# Patient Record
Sex: Female | Born: 2000 | Race: White | Hispanic: No | Marital: Single | Smoking: Never smoker
Health system: Southern US, Community
[De-identification: ages and names within clinical notes are randomized; demographics above are authoritative.]

## PROBLEM LIST (undated history)

## (undated) ENCOUNTER — Ambulatory Visit (HOSPITAL_COMMUNITY): Admission: EM | Source: Home / Self Care

## (undated) DIAGNOSIS — T7840XA Allergy, unspecified, initial encounter: Secondary | ICD-10-CM

## (undated) DIAGNOSIS — F419 Anxiety disorder, unspecified: Secondary | ICD-10-CM

## (undated) DIAGNOSIS — F32A Depression, unspecified: Secondary | ICD-10-CM

## (undated) DIAGNOSIS — F988 Other specified behavioral and emotional disorders with onset usually occurring in childhood and adolescence: Secondary | ICD-10-CM

## (undated) DIAGNOSIS — E871 Hypo-osmolality and hyponatremia: Secondary | ICD-10-CM

## (undated) HISTORY — DX: Allergy, unspecified, initial encounter: T78.40XA

## (undated) HISTORY — DX: Other specified behavioral and emotional disorders with onset usually occurring in childhood and adolescence: F98.8

---
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
## 2022-03-14 ENCOUNTER — Encounter (HOSPITAL_BASED_OUTPATIENT_CLINIC_OR_DEPARTMENT_OTHER): Payer: Self-pay | Admitting: Emergency Medicine

## 2022-03-14 ENCOUNTER — Emergency Department (HOSPITAL_BASED_OUTPATIENT_CLINIC_OR_DEPARTMENT_OTHER)
Admission: EM | Admit: 2022-03-14 | Discharge: 2022-03-15 | Disposition: A | Discharge status: Home / Self Care | Payer: Medicaid Other | Attending: Emergency Medicine | Admitting: Emergency Medicine

## 2022-03-14 DIAGNOSIS — T7840XA Allergy, unspecified, initial encounter: Secondary | ICD-10-CM | POA: Insufficient documentation

## 2022-03-14 HISTORY — DX: Hypo-osmolality and hyponatremia: E87.1

## 2022-03-14 NOTE — ED Triage Notes (Signed)
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

## 2022-03-15 MED ORDER — PREDNISONE 10 MG (21) PO TBPK: ORAL_TABLET | ORAL | 0 refills | Status: DC

## 2022-03-15 MED ORDER — FAMOTIDINE IN NACL 20-0.9 MG/50ML-% IV SOLN
20.0000 mg | Freq: Once | INTRAVENOUS | Status: AC
  Administered 2022-03-15: 20 mg via INTRAVENOUS
  Filled 2022-03-15: qty 50

## 2022-03-15 MED ORDER — METHYLPREDNISOLONE SODIUM SUCC 125 MG IJ SOLR
125.0000 mg | Freq: Once | INTRAMUSCULAR | Status: AC
  Administered 2022-03-15: 125 mg via INTRAVENOUS
  Filled 2022-03-15: qty 2

## 2022-03-15 MED ORDER — DIPHENHYDRAMINE HCL 50 MG/ML IJ SOLN
25.0000 mg | Freq: Once | INTRAMUSCULAR | Status: AC
  Administered 2022-03-15: 25 mg via INTRAVENOUS
  Filled 2022-03-15: qty 1

## 2022-03-15 NOTE — ED Provider Notes (Signed)
[facility] EMERGENCY DEPARTMENT  Provider Note  CSN: [id number] Arrival date & time: 03/14/22 2326  History Chief Complaint  Patient presents with   Allergic Reaction    Rebecca Nolan is a 21 y.o. 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 She took some oral benadryl without improvement, reports the rash is now spreading and burning. She reports feeling some 'tightness' when she breathes but no tongue or lip swelling and no difficulty breathing.    Home Medications Prior to Admission medications   Medication Sig Start Date End Date Taking? Authorizing Provider  predniSONE (STERAPRED UNI-PAK 21 TAB) 10 MG (21) TBPK tablet 10mg  Tabs, 6 day taper. Use as directed 03/15/22  Yes 03/17/22, MD  benzonatate (TESSALON) 100 MG capsule Take 1-2 capsules (100-200 mg total) by mouth 3 (three) times daily as needed for cough. 08/16/16   McVey, 08/18/16, PA-C  Cholecalciferol (VITAMIN D3) 50 MCG (2000 UT) capsule 1 capsule    [provider]  fluticasone (FLONASE) 50 MCG/ACT nasal spray Place 2 sprays into both nostrils daily. 08/16/16   McVey, 08/18/16, PA-C  fluvoxaMINE (LUVOX) 50 MG tablet Take 50 mg by mouth at bedtime. 10/12/20   [provider]  hydrOXYzine (ATARAX/VISTARIL) 25 MG tablet TAKE 1-2 TABLETS AT BEDTIME FOR SLEEP    [provider]  lamoTRIgine (LAMICTAL) 25 MG tablet 1 tablet    [provider]  norethindrone (MICRONOR) 0.35 MG tablet Take 1 tablet by mouth daily. 06/30/20   [provider]  pantoprazole (PROTONIX) 40 MG tablet 1 TABLET BEFORE A MEAL ORALLY ONCE A DAY 90 DAYS 01/28/19   [provider]  prazosin (MINIPRESS) 2 MG capsule Take 2 mg by mouth 2 (two) times daily. 06/05/20   [provider]  silver sulfADIAZINE (SILVADENE) 1 % cream Apply 1 application topically daily. 10/13/20   10/15/20, MD     Allergies    Beef allergy and  Amoxicillin   Review of Systems   Review of Systems Please see HPI for pertinent positives and negatives  Physical Exam BP 111/65   Pulse 90   Temp 98.4 F (36.9 C) (Oral)   Resp 18   Ht 5' 6.75" (1.695 m)   Wt 55.8 kg   SpO2 98%   BMI 19.41 kg/m   Physical Exam Vitals and nursing note reviewed.  Constitutional:      Appearance: Normal appearance.  HENT:     Head: Normocephalic and atraumatic.     Nose: Nose normal.     Mouth/Throat:     Mouth: Mucous membranes are moist.     Comments: No tongue swelling Eyes:     Extraocular Movements: Extraocular movements intact.     Conjunctiva/sclera: Conjunctivae normal.  Cardiovascular:     Rate and Rhythm: Normal rate.  Pulmonary:     Effort: Pulmonary effort is normal.     Breath sounds: Normal breath sounds. No stridor.  Abdominal:     General: Abdomen is flat.     Palpations: Abdomen is soft.     Tenderness: There is no abdominal tenderness.  Musculoskeletal:        General: No swelling. Normal range of motion.     Cervical back: Neck supple.  Skin:    General: Skin is warm and dry.     Comments: Excoriations and some hives on arms and torso  Neurological:     General: No focal deficit present.  Mental Status: She is alert.  Psychiatric:        Mood and Affect: Mood normal.     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 Will give IV meds and reassess. Do not feel Epi is indicated  at this time.   ED Course   Clinical Course as of 03/15/22 0202  Sat Mar 15, 2022  0200 Patient reports rash and skin burning are improved. She is resting comfortably with mother at bedside. Will d/c with Rx for prednisone. Advised to follow up with PCP and/or Allergist if she continues to have similar reactions. RTED for any other concerns.  [CS]    Clinical Course User Index [CS] Pollyann Savoy, MD     MDM Rules/Calculators/A&P Medical Decision Making Given presenting complaint, I considered that admission might be necessary. After review of results from ED lab and/or imaging studies, admission to the hospital is not indicated at this time.    Problems Addressed: Allergic reaction, initial encounter: acute illness or injury  Risk Prescription drug management. Decision regarding hospitalization.    Final Clinical Impression(s) / ED Diagnoses Final diagnoses:  Allergic reaction, initial encounter    Rx / DC Orders ED Discharge Orders          Ordered    predniSONE (STERAPRED UNI-PAK 21 TAB) 10 MG (21) TBPK tablet        03/15/22 0201             Pollyann Savoy, MD 03/15/22 [phone]

## 2022-07-04 ENCOUNTER — Encounter (HOSPITAL_COMMUNITY): Payer: Self-pay

## 2022-07-04 ENCOUNTER — Ambulatory Visit (HOSPITAL_COMMUNITY)
Admission: EM | Admit: 2022-07-04 | Discharge: 2022-07-04 | Disposition: A | Discharge status: Home / Self Care | Payer: Medicaid Other | Attending: Internal Medicine | Admitting: Internal Medicine

## 2022-07-04 DIAGNOSIS — Z1152 Encounter for screening for COVID-19: Secondary | ICD-10-CM | POA: Insufficient documentation

## 2022-07-04 DIAGNOSIS — J029 Acute pharyngitis, unspecified: Secondary | ICD-10-CM | POA: Insufficient documentation

## 2022-07-04 DIAGNOSIS — R509 Fever, unspecified: Secondary | ICD-10-CM | POA: Diagnosis not present

## 2022-07-04 LAB — POCT URINALYSIS DIPSTICK, ED / UC
Bilirubin Urine: NEGATIVE
Glucose, UA: NEGATIVE mg/dL
Ketones, ur: 15 mg/dL — AB
Leukocytes,Ua: NEGATIVE
Nitrite: NEGATIVE
Protein, ur: NEGATIVE mg/dL
Specific Gravity, Urine: 1.02 (ref 1.005–1.030)
Urobilinogen, UA: 0.2 mg/dL (ref 0.0–1.0)
pH: 5.5 (ref 5.0–8.0)

## 2022-07-04 LAB — POCT RAPID STREP A, ED / UC: Streptococcus, Group A Screen (Direct): NEGATIVE

## 2022-07-04 LAB — POC INFLUENZA A AND B ANTIGEN (URGENT CARE ONLY)
INFLUENZA A ANTIGEN, POC: NEGATIVE
INFLUENZA B ANTIGEN, POC: NEGATIVE

## 2022-07-04 MED ORDER — CEFDINIR 300 MG PO CAPS: 300.0000 mg | ORAL_CAPSULE | Freq: Two times a day (BID) | ORAL | 0 refills | Status: AC

## 2022-07-04 MED ORDER — ACETAMINOPHEN 325 MG PO TABS
ORAL_TABLET | ORAL | Status: AC
  Filled 2022-07-04: qty 2

## 2022-07-04 MED ORDER — ACETAMINOPHEN 325 MG PO TABS
650.0000 mg | ORAL_TABLET | Freq: Once | ORAL | Status: AC
  Administered 2022-07-04: 650 mg via ORAL

## 2022-07-04 NOTE — ED Triage Notes (Signed)
Pt c/o sore throat since yesterday and fever today. States having back pain today and white patches on her tonsils. Denies taking any meds.

## 2022-07-04 NOTE — Discharge Instructions (Signed)
Your strep test was negative in clinic, however your symptoms are consistent with strep and we will start you on cefdinir.  We did a strep culture, we will call if negative and you can stop the antibiotics.  You had a previous rash to amoxicillin, we have started you on cefdinir.  Please call the clinic immediately if you develop rash, hives, shortness of breath, as we may need to alter your antibiotics.  Your flu test was negative in clinic.  We have swabbed you for COVID-19 and will call if results is positive.  Regardless, all results will be available in your MyChart.  Please take Tylenol and ibuprofen as needed, alternating every 6-8 hours for fever and discomfort.  You may return to work once 24 hours fever free.  Please return to clinic if you have no resolution of symptoms, or any changes.

## 2022-07-04 NOTE — ED Provider Notes (Signed)
[place]    CSN: [id number] Arrival date & time: 07/04/22  1330      History   Chief Complaint Chief Complaint  Patient presents with   Sore Throat   Fever    HPI Rebecca Nolan is a 22 y.o. female.   Reports congestion and feeling unwell x3 days, sore throat for the past two days and fever today. Reports white patches to back of tonsils. Ale to eat and drink, painful. Reports lower back pain. Denies current cough, SOB, CP or abdominal pain. Denies dysuria.   Recently finished her period, during which she spotted, had abdominal pain and cramping. Nexplanon in place.   Penicillin allergic, reports full body rash.   The history is provided by the patient and a parent.  Sore Throat Pertinent negatives include no abdominal pain.  Fever Associated symptoms: rhinorrhea and sore throat   Associated symptoms: no cough and no dysuria     Past Medical History:  Diagnosis Date   ADD (attention deficit disorder)    Allergy    Hyponatremia     There are no problems to display for this patient.   History reviewed. No pertinent surgical history.  OB History   No obstetric history on file.      Home Medications    Prior to Admission medications   Medication Sig Start Date End Date Taking? Authorizing Provider  cefdinir (OMNICEF) 300 MG capsule Take 1 capsule (300 mg total) by mouth 2 (two) times daily for 5 days. 07/04/22 07/09/22 Yes Louretta Shorten, [place], FNP  Cholecalciferol (VITAMIN D3) 50 MCG (2000 UT) capsule 1 capsule    [provider]  fluvoxaMINE (LUVOX) 50 MG tablet Take 50 mg by mouth at bedtime. 10/12/20   [provider]  hydrOXYzine (ATARAX/VISTARIL) 25 MG tablet TAKE 1-2 TABLETS AT BEDTIME FOR SLEEP    [provider]  lamoTRIgine (LAMICTAL) 25 MG tablet 1 tablet    [provider]  norethindrone (MICRONOR) 0.35 MG tablet Take 1 tablet by mouth daily. 06/30/20   [provider]  pantoprazole (PROTONIX)  40 MG tablet 1 TABLET BEFORE A MEAL ORALLY ONCE A DAY 90 DAYS 01/28/19   [provider]  prazosin (MINIPRESS) 2 MG capsule Take 2 mg by mouth 2 (two) times daily. 06/05/20   [provider]    Family History Family History  Problem Relation Age of Onset   Healthy Mother    Healthy Father    Asthma Brother    Diabetes Other    Cancer Other    Coronary artery disease Other     Social History Social History   Tobacco Use   Smoking status: Never   Smokeless tobacco: Never  Vaping Use   Vaping Use: Never used  Substance Use Topics   Alcohol use: Never   Drug use: Never     Allergies   Beef allergy and Amoxicillin   Review of Systems Review of Systems  Constitutional:  Positive for fever. Negative for appetite change.  HENT:  Positive for rhinorrhea and sore throat.   Respiratory:  Negative for cough.   Gastrointestinal:  Negative for abdominal pain.  Genitourinary:  Negative for dysuria.  Musculoskeletal:  Positive for arthralgias and back pain.     Physical Exam Triage Vital Signs ED Triage Vitals [07/04/22 1342]  Enc Vitals Group     BP 104/65     Pulse Rate 99     Resp 18     Temp (!) 100.6  F (38.1 C)     Temp Source Oral     SpO2 99 %     Weight      Height      Head Circumference      Peak Flow      Pain Score 8     Pain Loc      Pain Edu?      Excl. in [place]?    No data found.  Updated Vital Signs BP 104/65 (BP Location: Left Arm)   Pulse 99   Temp (!) 100.6 F (38.1 C) (Oral)   Resp 18   SpO2 99%   Visual Acuity Right Eye Distance:   Left Eye Distance:   Bilateral Distance:    Right Eye Near:   Left Eye Near:    Bilateral Near:     Physical Exam Vitals and nursing note reviewed.  Constitutional:      General: She is not in acute distress.    Appearance: She is well-developed.  HENT:     Head: Normocephalic and atraumatic.     Nose: Congestion and rhinorrhea present.     Mouth/Throat:     Mouth: Mucous  membranes are moist. No oral lesions.     Pharynx: Uvula midline. Posterior oropharyngeal erythema present. No pharyngeal swelling or uvula swelling.     Tonsils: Tonsillar exudate present. No tonsillar abscesses. 2+ on the right. 2+ on the left.  Eyes:     Conjunctiva/sclera: Conjunctivae normal.  Cardiovascular:     Rate and Rhythm: Normal rate and regular rhythm.     Heart sounds: Normal heart sounds. No murmur heard. Pulmonary:     Effort: Pulmonary effort is normal. No respiratory distress.     Breath sounds: Normal breath sounds.  Abdominal:     Palpations: Abdomen is soft.     Tenderness: There is no abdominal tenderness. There is right CVA tenderness and left CVA tenderness.  Musculoskeletal:        General: No swelling. Normal range of motion.     Cervical back: Normal range of motion and neck supple.  Skin:    General: Skin is warm and dry.     Capillary Refill: Capillary refill takes less than 2 seconds.  Neurological:     Mental Status: She is alert.  Psychiatric:        Mood and Affect: Mood normal.      UC Treatments / Results  Labs (all labs ordered are listed, but only abnormal results are displayed) Labs Reviewed  POCT URINALYSIS DIPSTICK, ED / UC - Abnormal; Notable for the following components:      Result Value   Ketones, ur 15 (*)    Hgb urine dipstick LARGE (*)    All other components within normal limits  CULTURE, GROUP A STREP ([place])  SARS CORONAVIRUS 2 (TAT 6-24 HRS)  URINE CULTURE  POCT RAPID STREP A, ED / UC  POC INFLUENZA A AND B ANTIGEN (URGENT CARE ONLY)    EKG   Radiology No results found.  Procedures Procedures (including critical care time)  Medications Ordered in UC Medications  acetaminophen (TYLENOL) tablet 650 mg (650 mg Oral Given 07/04/22 1351)    Initial Impression / Assessment and Plan / UC Course  I have reviewed the triage vital signs and the nursing notes.  Pertinent labs & imaging results that were available  during my care of the patient were reviewed by me and considered in my medical decision making (see chart for  details).  Vitals and triage reviewed, patient is hemodynamically stable.  Febrile in clinic, provided with Tylenol.  Tonsils with patchy exudate and erythema.  Uvula midline, soft palate intact, low concern for peritonsillar abscess.  Rapid strep in clinic negative, sent for culture. Due to presentation and penicillin allergy, will start on Cefdinir in clinic. CVA tenderness on exam, urinalysis negative for leukocytes or nitrites. Positive for blood, will send for culture. Recently had menses. Flu swab negative. Covid-19 test pending. Discussed symptomatic management, POC and return precautions, patient verbalized understanding.     Final Clinical Impressions(s) / UC Diagnoses   Final diagnoses:  Acute pharyngitis, unspecified etiology     Discharge Instructions      Your strep test was negative in clinic, however your symptoms are consistent with strep and we will start you on cefdinir.  We did a strep culture, we will call if negative and you can stop the antibiotics.  You had a previous rash to amoxicillin, we have started you on cefdinir.  Please call the clinic immediately if you develop rash, hives, shortness of breath, as we may need to alter your antibiotics.  Your flu test was negative in clinic.  We have swabbed you for COVID-19 and will call if results is positive.  Regardless, all results will be available in your MyChart.  Please take Tylenol and ibuprofen as needed, alternating every 6-8 hours for fever and discomfort.  You may return to work once 24 hours fever free.  Please return to clinic if you have no resolution of symptoms, or any changes.     ED Prescriptions     Medication Sig Dispense Auth. Provider   cefdinir (OMNICEF) 300 MG capsule Take 1 capsule (300 mg total) by mouth 2 (two) times daily for 5 days. 10 capsule Darthy Manganelli, [place], FNP      I have  reviewed the PDMP during this encounter.   Dameon Soltis, [place] N, [place] 07/04/22 [phone]

## 2022-07-05 LAB — URINE CULTURE

## 2022-07-05 LAB — SARS CORONAVIRUS 2 (TAT 6-24 HRS): SARS Coronavirus 2: NEGATIVE

## 2022-07-06 LAB — CULTURE, GROUP A STREP (THRC)

## 2022-10-31 DIAGNOSIS — F419 Anxiety disorder, unspecified: Secondary | ICD-10-CM | POA: Diagnosis not present

## 2022-10-31 DIAGNOSIS — R41840 Attention and concentration deficit: Secondary | ICD-10-CM | POA: Diagnosis not present

## 2022-10-31 DIAGNOSIS — Z681 Body mass index (BMI) 19 or less, adult: Secondary | ICD-10-CM | POA: Diagnosis not present

## 2022-12-02 DIAGNOSIS — Z681 Body mass index (BMI) 19 or less, adult: Secondary | ICD-10-CM | POA: Diagnosis not present

## 2022-12-02 DIAGNOSIS — J069 Acute upper respiratory infection, unspecified: Secondary | ICD-10-CM | POA: Diagnosis not present

## 2023-09-03 ENCOUNTER — Encounter (HOSPITAL_COMMUNITY): Payer: Self-pay

## 2023-09-03 ENCOUNTER — Emergency Department (HOSPITAL_COMMUNITY)
Admission: EM | Admit: 2023-09-03 | Discharge: 2023-09-04 | Disposition: A | Discharge status: Home / Self Care | Attending: Emergency Medicine | Admitting: Emergency Medicine

## 2023-09-03 DIAGNOSIS — S8002XA Contusion of left knee, initial encounter: Secondary | ICD-10-CM | POA: Diagnosis not present

## 2023-09-03 DIAGNOSIS — Y92410 Unspecified street and highway as the place of occurrence of the external cause: Secondary | ICD-10-CM | POA: Insufficient documentation

## 2023-09-03 DIAGNOSIS — S0081XA Abrasion of other part of head, initial encounter: Secondary | ICD-10-CM | POA: Diagnosis not present

## 2023-09-03 DIAGNOSIS — S61412A Laceration without foreign body of left hand, initial encounter: Secondary | ICD-10-CM | POA: Diagnosis not present

## 2023-09-03 DIAGNOSIS — S30811A Abrasion of abdominal wall, initial encounter: Secondary | ICD-10-CM | POA: Insufficient documentation

## 2023-09-03 DIAGNOSIS — E041 Nontoxic single thyroid nodule: Secondary | ICD-10-CM | POA: Insufficient documentation

## 2023-09-03 DIAGNOSIS — M25562 Pain in left knee: Secondary | ICD-10-CM | POA: Diagnosis present

## 2023-09-03 HISTORY — DX: Depression, unspecified: F32.A

## 2023-09-03 HISTORY — DX: Anxiety disorder, unspecified: F41.9
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
## 2023-09-04 ENCOUNTER — Emergency Department (HOSPITAL_COMMUNITY)

## 2023-09-04 ENCOUNTER — Encounter (HOSPITAL_COMMUNITY): Payer: Self-pay

## 2023-09-04 LAB — BASIC METABOLIC PANEL WITH GFR
Anion gap: 10 (ref 5–15)
BUN: 12 mg/dL (ref 6–20)
CO2: 22 mmol/L (ref 22–32)
Calcium: 8.9 mg/dL (ref 8.9–10.3)
Chloride: 104 mmol/L (ref 98–111)
Creatinine, Ser: 0.59 mg/dL (ref 0.44–1.00)
GFR, Estimated: >60 mL/min (ref >60)
Glucose, Bld: 96 mg/dL (ref 70–99)
Potassium: 3.8 mmol/L (ref 3.5–5.1)
Sodium: 136 mmol/L (ref 135–145)

## 2023-09-04 LAB — CBC
HCT: 41.5 % (ref 36.0–46.0)
Hemoglobin: 13.8 g/dL (ref 12.0–15.0)
MCH: 30.7 pg (ref 26.0–34.0)
MCHC: 33.3 g/dL (ref 30.0–36.0)
MCV: 92.2 fL (ref 80.0–100.0)
Platelets: 279 10*3/uL (ref 150–400)
RBC: 4.5 MIL/uL (ref 3.87–5.11)
RDW: 12.9 % (ref 11.5–15.5)
WBC: 13.2 10*3/uL — ABNORMAL HIGH (ref 4.0–10.5)
nRBC: 0 % (ref 0.0–0.2)

## 2023-09-04 LAB — HCG, SERUM, QUALITATIVE: Preg, Serum: NEGATIVE

## 2023-09-04 MED ORDER — IBUPROFEN 800 MG PO TABS
800.0000 mg | ORAL_TABLET | Freq: Once | ORAL | Status: AC
  Administered 2023-09-04: 800 mg via ORAL
  Filled 2023-09-04: qty 1

## 2023-09-04 MED ORDER — IOHEXOL 300 MG/ML  SOLN
80.0000 mL | Freq: Once | INTRAMUSCULAR | Status: AC | PRN
  Administered 2023-09-04: 80 mL via INTRAVENOUS

## 2023-09-04 NOTE — ED Notes (Signed)
 This RN provided wound care to patient's L fourth finger and L knee
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
## 2024-05-11 ENCOUNTER — Emergency Department (HOSPITAL_COMMUNITY)

## 2024-05-11 ENCOUNTER — Emergency Department (HOSPITAL_COMMUNITY)
Admission: EM | Admit: 2024-05-11 | Discharge: 2024-05-11 | Disposition: A | Discharge status: Home / Self Care | Attending: Emergency Medicine | Admitting: Emergency Medicine

## 2024-05-11 DIAGNOSIS — R519 Headache, unspecified: Secondary | ICD-10-CM | POA: Insufficient documentation

## 2024-05-11 DIAGNOSIS — R0602 Shortness of breath: Secondary | ICD-10-CM | POA: Diagnosis present

## 2024-05-11 DIAGNOSIS — E876 Hypokalemia: Secondary | ICD-10-CM | POA: Diagnosis not present

## 2024-05-11 LAB — CBC WITH DIFFERENTIAL/PLATELET
Abs Immature Granulocytes: 0.1 K/uL — ABNORMAL HIGH (ref 0.00–0.07)
Basophils Absolute: 0 K/uL (ref 0.0–0.1)
Basophils Relative: 0 %
Eosinophils Absolute: 0 K/uL (ref 0.0–0.5)
Eosinophils Relative: 0 %
HCT: 39.6 % (ref 36.0–46.0)
Hemoglobin: 13.4 g/dL (ref 12.0–15.0)
Immature Granulocytes: 1 %
Lymphocytes Relative: 6 %
Lymphs Abs: 0.4 K/uL — ABNORMAL LOW (ref 0.7–4.0)
MCH: 31.8 pg (ref 26.0–34.0)
MCHC: 33.8 g/dL (ref 30.0–36.0)
MCV: 93.8 fL (ref 80.0–100.0)
Monocytes Absolute: 0.1 K/uL (ref 0.1–1.0)
Monocytes Relative: 1 %
Neutro Abs: 7 K/uL (ref 1.7–7.7)
Neutrophils Relative %: 92 %
Platelets: 360 K/uL (ref 150–400)
RBC: 4.22 MIL/uL (ref 3.87–5.11)
RDW: 12.7 % (ref 11.5–15.5)
WBC: 7.6 K/uL (ref 4.0–10.5)
nRBC: 0 % (ref 0.0–0.2)

## 2024-05-11 LAB — COMPREHENSIVE METABOLIC PANEL WITH GFR
ALT: 20 U/L (ref 0–44)
AST: 26 U/L (ref 15–41)
Albumin: 4.6 g/dL (ref 3.5–5.0)
Alkaline Phosphatase: 74 U/L (ref 38–126)
Anion gap: 17 — ABNORMAL HIGH (ref 5–15)
BUN: 7 mg/dL (ref 6–20)
CO2: 22 mmol/L (ref 22–32)
Calcium: 9.5 mg/dL (ref 8.9–10.3)
Chloride: 100 mmol/L (ref 98–111)
Creatinine, Ser: 0.65 mg/dL (ref 0.44–1.00)
GFR, Estimated: >60 mL/min
Glucose, Bld: 135 mg/dL — ABNORMAL HIGH (ref 70–99)
Potassium: 3.2 mmol/L — ABNORMAL LOW (ref 3.5–5.1)
Sodium: 140 mmol/L (ref 135–145)
Total Bilirubin: 0.4 mg/dL (ref 0.0–1.2)
Total Protein: 7.1 g/dL (ref 6.5–8.1)

## 2024-05-11 LAB — HCG, SERUM, QUALITATIVE: Preg, Serum: NEGATIVE

## 2024-05-11 LAB — D-DIMER, QUANTITATIVE: D-Dimer, Quant: <0.27 ug{FEU}/mL (ref 0.00–0.50)

## 2024-05-11 LAB — RESP PANEL BY RT-PCR (RSV, FLU A&B, COVID)  RVPGX2
Influenza A by PCR: NEGATIVE
Influenza B by PCR: NEGATIVE
Resp Syncytial Virus by PCR: NEGATIVE
SARS Coronavirus 2 by RT PCR: NEGATIVE

## 2024-05-11 MED ORDER — POTASSIUM CHLORIDE 20 MEQ PO PACK
40.0000 meq | PACK | Freq: Once | ORAL | Status: AC
  Administered 2024-05-11: 40 meq via ORAL
  Filled 2024-05-11: qty 2

## 2024-05-11 MED ORDER — DIPHENHYDRAMINE HCL 50 MG/ML IJ SOLN
25.0000 mg | Freq: Once | INTRAMUSCULAR | Status: AC
  Administered 2024-05-11: 25 mg via INTRAVENOUS
  Filled 2024-05-11: qty 1

## 2024-05-11 MED ORDER — LACTATED RINGERS IV BOLUS
1000.0000 mL | Freq: Once | INTRAVENOUS | Status: AC
  Administered 2024-05-11: 1000 mL via INTRAVENOUS

## 2024-05-11 MED ORDER — PROCHLORPERAZINE EDISYLATE 10 MG/2ML IJ SOLN
10.0000 mg | Freq: Once | INTRAMUSCULAR | Status: AC
  Administered 2024-05-11: 10 mg via INTRAVENOUS
  Filled 2024-05-11: qty 2

## 2024-05-11 NOTE — ED Triage Notes (Signed)
 BIB EMS from work for sudden onset SOB. Had at home covid test one week ago which was positive. Test yesterday was negative. Still having symptoms. Given 2.5mg  of albuterol neb. Was having difficulty speaking more than a few words at a time.Also given 125mg  of solumedrol IV  Now speaking in full sentences.

## 2024-05-11 NOTE — ED Notes (Signed)
 Pulse ox reading 99%-100% while ambulating

## 2024-05-11 NOTE — ED Provider Notes (Signed)
 " [place] EMERGENCY DEPARTMENT AT [facility] Provider Note   CSN: [id number] Arrival date & time: 05/11/24  9045     Patient presents with: Shortness of Breath   Rebecca Nolan is a 24 y.o. female with past medical history of ADD, anxiety presents to Emergency Department for evaluation of syncopal episode, shortness of breath, palpitations, headache.  Reports that she has had intermittent palpitations over the past 4 days.  Is scheduled to follow-up with cardiology tomorrow and have Holter monitor to ensure no arrhythmias.  She recently tested positive for COVID with home test 1 week ago and flu 4 weeks ago.  Today, reports that she was sitting down at work when she suddenly had shortness of breath and could not get a deep breath then.  She then reports feeling fatigue then had a witnessed syncopal episode for 5 seconds.  This syncope was witnessed by coworker who was able to get patient to ground without any injury.  Coworker denies head injury, seizures, postictal confusion.  Does not have known history of asthma but typically receives inhalers following URI for wheezing. Patient does have inhaler for shortness of breath but did not have it with her so did not use it today.  EMS provided DuoNeb, Solu-Medrol  with improvement of shortness of breath.  Denies history of VTE, hemoptysis, pedal edema, recent travel, recent surgery.  Patient does have Nexplanon  implant.  While in ED, patient complains of migraine coming on.  Feels similar to migraines that she has had in the past.  Did not come on at maximum intensity and is progressively worsening.  No visual disturbances, loss of vision, AMS.     Shortness of Breath      Prior to Admission medications  Medication Sig Start Date End Date Taking? Authorizing Provider  lamoTRIgine (LAMICTAL) 200 MG tablet Take 200 mg by mouth daily. 04/25/24   [provider]  propranolol (INDERAL) 10 MG tablet Take 5 mg by mouth as  needed. 04/29/24   [provider]    Allergies: Beef allergy, Milk-related compounds, and Amoxicillin     Review of Systems  Respiratory:  Positive for shortness of breath.     Updated Vital Signs BP (!) 101/41   Pulse 100   Temp 98.3 F (36.8 C) (Oral)   Resp 18   SpO2 97%   Physical Exam Vitals and nursing note reviewed.  Constitutional:      General: She is not in acute distress.    Appearance: Normal appearance.  HENT:     Head: Normocephalic and atraumatic.  Eyes:     General: Lids are normal. Vision grossly intact. No visual field deficit.    Extraocular Movements:     Right eye: Normal extraocular motion and no nystagmus.     Left eye: Normal extraocular motion and no nystagmus.     Conjunctiva/sclera: Conjunctivae normal.  Cardiovascular:     Rate and Rhythm: Normal rate.  Pulmonary:     Effort: Pulmonary effort is normal. No tachypnea or respiratory distress.     Breath sounds: Normal breath sounds.     Comments: Oxygen saturation 100% without supplementation.  No signs of respiratory distress Musculoskeletal:     Cervical back: Normal range of motion and neck supple. No rigidity.     Right lower leg: No tenderness. No edema.     Left lower leg: No tenderness. No edema.  Skin:    Coloration: Skin is not jaundiced or pale.  Neurological:  Mental Status: She is alert and oriented to person, place, and time. Mental status is at baseline.     GCS: GCS eye subscore is 4. GCS verbal subscore is 5. GCS motor subscore is 6.     Cranial Nerves: No cranial nerve deficit, dysarthria or facial asymmetry.     Sensory: Sensation is intact. No sensory deficit.     Motor: No weakness, tremor, atrophy, abnormal muscle tone, seizure activity or pronator drift.     Coordination: Coordination normal. Finger-Nose-Finger Test and Heel to [place] Test normal.     Gait: Gait is intact.     Comments: Motor 5/5 and sensation 2/2 BUE and BLE.  No speech nor visual deficits  Denies dizziness     (all labs ordered are listed, but only abnormal results are displayed) Labs Reviewed  CBC WITH DIFFERENTIAL/PLATELET - Abnormal; Notable for the following components:      Result Value   Lymphs Abs 0.4 (*)    Abs Immature Granulocytes 0.10 (*)    All other components within normal limits  COMPREHENSIVE METABOLIC PANEL WITH GFR - Abnormal; Notable for the following components:   Potassium 3.2 (*)    Glucose, Bld 135 (*)    Anion gap 17 (*)    All other components within normal limits  RESP PANEL BY RT-PCR (RSV, FLU A&B, COVID)  RVPGX2  HCG, SERUM, QUALITATIVE  D-DIMER, QUANTITATIVE    EKG: EKG Interpretation Date/Time:  Wednesday May 11 2024 14:07:20 EST Ventricular Rate:  98 PR Interval:  131 QRS Duration:  73 QT Interval:  345 QTC Calculation: 441 R Axis:   86  Text Interpretation: Sinus rhythm Probable left atrial enlargement Probable left ventricular hypertrophy Borderline T abnormalities, inferior leads Since last tracing rate slower Otherwise no significant change Confirmed by Emil Share [phone]) on 05/11/2024 4:57:24 PM  Radiology: [facility] Chest 2 View Result Date: 05/11/2024 CLINICAL DATA:  Shortness of breath EXAM: CHEST - 2 VIEW COMPARISON:  Two days ago FINDINGS: The heart size and mediastinal contours are within normal limits. Both lungs are clear. The visualized skeletal structures are unremarkable. IMPRESSION: No active cardiopulmonary disease. Electronically Signed   By: Lynwood Landy Raddle M.D.   On: 05/11/2024 11:18    Medications Ordered in the ED  prochlorperazine  (COMPAZINE ) injection 10 mg (10 mg Intravenous Given 05/11/24 1644)  diphenhydrAMINE  (BENADRYL ) injection 25 mg (25 mg Intravenous Given 05/11/24 1643)  potassium chloride  (KLOR-CON ) packet 40 mEq (40 mEq Oral Given 05/11/24 1643)  lactated ringers  bolus 1,000 mL (0 mLs Intravenous Stopped 05/11/24 1746)    Clinical Course as of 05/12/24 2330  Wed May 11, 2024  1638 Anion gap(!):  17 CBG 135. No hx of diabetes. No recent etoh, ASA. No signs of uremia. Likely 2/2 dehydration. Will provide LR for hydration [LB]    Clinical Course User Index [LB] Minnie Tinnie BRAVO, PA                                 Medical Decision Making Amount and/or Complexity of Data Reviewed Labs: ordered. Decision-making details documented in ED Course. Radiology: ordered.  Risk Prescription drug management.   Patient presents to the ED for concern of shob, syncope, HA, this involves an extensive number of treatment options, and is a complaint that carries with it a high risk of complications and morbidity.  The differential diagnosis includes COVID, flu, RSV, pneumonia, fluid overload, asthma exacerbation, hypoxia, PE,  electrolyte abnormality, ACS, ICH, SAH, migraine, dehydration   Co morbidities that complicate the patient evaluation  none   Additional history obtained:  Additional history obtained from Nursing and Outside Medical Records   External records from outside source obtained and reviewed including triage RN note, UC note from 04/26/24   Lab Tests:  I Ordered, and personally interpreted labs.  The pertinent results include:   Resp panel negative K+ 3.2 CBG 135   Imaging Studies ordered:  I ordered imaging studies including CXR  I independently visualized and interpreted imaging which showed no cardiopulmonary pathology I agree with the radiologist interpretation   Cardiac Monitoring:  The patient was maintained on a cardiac monitor.  I personally viewed and interpreted the cardiac monitored which showed an underlying rhythm of: NSR at 98bpm   Medicines ordered and prescription drug management:  I ordered medication including compazine , benadryl , LR, potassium for migraine, hydration, mild hypokalemia Reevaluation of the patient after these medicines showed that the patient improved I have reviewed the patients home medicines and have made adjustments as  needed    Problem List / ED Course:  SHOB Cough Congestion Vital signs hemodynamically stable.  Patient was initially tachycardic at 114 bpm on arrival.  No hypoxia.  She was provided DuoNeb prior to arrival which can contribute to tachycardia.  Since then, patient's heart rate has decreased to 98-100 bpm.  Never had hypoxia in ED. On my assessment, there is absolutely no respiratory distress.  Lung sounds CTAB with no wheezing. No pedal edema, tenderness.  [place]' sign negative x 2.  No history of VTE Patient had sudden onset of shortness of breath causing her to hyperventilate and have a 5-second syncopal episode Patient has also been having palpitations over the past week.  She does have follow-up with cardiology tomorrow for Holter monitor placement Chest x-ray without effusion nor pneumonia EKG shows NSR at 98 bpm with no STE nor T wave abnormalities.  No complaints of chest pain nor history of CAD or other cardiac conditions Dimer negative. Low suspicion for PE Patient does not appear fluid overloaded.  No pedal edema.  No effusion on chest x-ray Symptoms seem synonymous with viral URI.  Resp panel neg  Syncope Witnessed.  5 seconds long. Following hyperventilation. Coworker ensure that patient did not hit head.  No imaging required. No postictal confusion or seizure-like activity witnessed.  HA Has history of migraine with auras and feels similar to migraines in past Did not come on at maximum intensity.  Low suspicion for ICH, SAH.  Do not think imaging is required at this time Migraine cocktail provided with resolution of symptoms. Remains neurologically intact with no focal deficits   Hypokalemia 3.2 No recent NVD Does not appear chronically hypokalemic however reports that she normally eats more bananas and has been lacking recently Provided p.o. supplementation in ED Recommended patient to follow-up with PCP for monitoring of electrolytes   Reevaluation:  After  the interventions noted above, I reevaluated the patient and found that they have :improved     Dispostion:  After consideration of the diagnostic results and the patients response to treatment, I feel that the patent would benefit from outpatient management symptomatic treatment.   Discussed ED workup, disposition, return to ED precautions with patient who expresses understanding agrees with plan.  All questions answered to their satisfaction.  They are agreeable to plan.  Discharge instructions provided on paperwork  Final diagnoses:  Shortness of breath  Acute nonintractable headache, unspecified headache  type  Hypokalemia    ED Discharge Orders     None        Minnie Tinnie BRAVO, [place] 05/12/24 2348  "

## 2024-05-11 NOTE — ED Provider Triage Note (Signed)
 Emergency Medicine Provider Triage Evaluation Note  Rebecca Nolan , a 24 y.o. female  was evaluated in triage.  Pt complains of shortness of breath.  Patient reports sudden onset shortness of breath starting yesterday with show with some associated feelings of palpitations.  Reports the symptoms have been off and on for the last 4 days.  States she was recently positive for COVID and has been using inhaler at home without significant proving in her shortness of breath.  Given albuterol and Solu-Medrol  earlier today which appear to have improved.  She denies any feelings of wheezing at this time.  No fevers.  Review of Systems  Positive: As above Negative: As above  Physical Exam  BP (!) 102/52 (BP Location: Right Arm)   Pulse 100   Temp 98.5 F (36.9 C) (Oral)   Resp 18   SpO2 100%  Gen:   Awake, no distress  Resp:  Normal effort, no wheezing, rales, rhonchi MSK:   Moves extremities without difficulty  Other:   Medical Decision Making  Medically screening exam initiated at 2:04 PM.  Appropriate orders placed.  Rebecca Nolan was informed that the remainder of the evaluation will be completed by another provider, this initial triage assessment does not replace that evaluation, and the importance of remaining in the ED until their evaluation is complete.     Dorrell Mitcheltree A, PA-C 05/11/24 1405

## 2024-05-11 NOTE — Discharge Instructions (Signed)
 Thank you for letting us  evaluate you today.  Your blood counts are normal.  You do not have signs of a blood clot.  Your chest x-ray did not show any fluid nor pneumonia.  Your COVID, flu, RSV negative.  We have provided you with a migraine cocktail, fluids here Emergency Department.  Please make sure to drink plenty of fluids.  Please follow-up with primary care provider within the next 1-2 weeks to ensure that you are improving and to recheck your potassium.  Return to ED for experience chest pain, shortness of breath, worsening symptoms

## 2024-05-12 ENCOUNTER — Emergency Department: Admitting: Physician Assistant

## 2024-05-12 ENCOUNTER — Ambulatory Visit: Attending: Internal Medicine | Admitting: Internal Medicine

## 2024-05-12 ENCOUNTER — Ambulatory Visit

## 2024-05-12 VITALS — BP 90/60 | HR 88 | Ht 66.0 in | Wt 121.0 lb

## 2024-05-12 DIAGNOSIS — I4719 Other supraventricular tachycardia: Secondary | ICD-10-CM | POA: Insufficient documentation

## 2024-05-12 NOTE — Progress Notes (Unsigned)
 Enrolled patient for a 14 day Zio XT  monitor to be mailed to patients home

## 2024-05-12 NOTE — Progress Notes (Signed)
 "   Cardiology Office Note   Date:  05/12/2024   ID:  Rebecca Nolan, DOB 2000-11-20, MRN [id number]  PCP:  Aisha Harvey, MD  Cardiologist:   Vina Gull, MD   Patient presents for SOB and palpitations     History of Present Illness:   Rebecca Nolan is a 24 y.o. female with a history of  mild asthmatic symptoms Yesterday she was at work  Early AM  Became very SOB   EMS called   Given nebulizer   Improved some   Got a steroid nebulizer in truck  Breathing improved more     In ER while waiting was a little SOB   By time went home from ER breathing was [facility] has felt fine since     No exposure at work to smells,  chemicals    The pt also says on Saturday she was at her computer   HR started going up  High  Up to 170   Pt became dizzy, developed tunnel vision.   Overall lasted about 2 hours  Up and down   Did not pass out completely.  Came close  When not having spells she is OK    Active Medications[1]   Allergies:   Beef allergy, Milk-related compounds, and Amoxicillin    Past Medical History:  Diagnosis Date   ADD (attention deficit disorder)    Allergy    Anxiety    Depression    Hyponatremia     No past surgical history on file.   Family History:  The patient's family history includes Asthma in her brother; Cancer in an other family member; Coronary artery disease in an other family member; Diabetes in an other family member; Healthy in her father and mother.    ROS:  Please see the history of present illness. All other systems are reviewed and  Negative to the above problem except as noted.    PHYSICAL EXAM: VS:  BP 90/60 (BP Location: Left Arm, Patient Position: Sitting, Cuff Size: Normal)   Pulse 88   Ht 5' 6 (1.676 m)   Wt 121 lb (54.9 kg)   SpO2 97%   BMI 19.53 kg/m   GEN:  Thin 24 yo , in no acute distress  HEENT: normal  Neck: no JVD, carotid bruits Cardiac: RRR; no murmurs Respiratory:  clear to auscultation b GI: soft, nontender,  nondistended, No hepatomegaly  Ex  No LE edema   EKG:  EKG is ordered tyesterday  SR 98 bpm   Nonspecific ST changes    Lipid Panel No results found for: CHOL, TRIG, HDL, CHOLHDL, VLDL, LDLCALC, LDLDIRECT    Wt Readings from Last 3 Encounters:  05/12/24 121 lb (54.9 kg)  09/03/23 108 lb (49 kg)  03/14/22 123 lb (55.8 kg) (41%, Z= -0.24)*   * Growth percentiles are based on CDC (Girls, 2-20 Years) data.      ASSESSMENT AND PLAN:  1  Dyspnea  Spell yesterday  Does not appear cardiac   ? Reactive airways to some stimulus    Has inhaler to use prn  2  Tachycardia  Episodes in recnet past     ? If arrhythmia   Will set up for an event monitor     Stay hydrated   Avoid stimulants  I also recomm she get a [place] device     Sit if develops dizziness   FOllow up based on results     Current medicines are reviewed at  length with the patient today.  The patient does not have concerns regarding medicines.  Signed, Vina Gull, MD        [1]  Current Meds  Medication Sig   lamoTRIgine (LAMICTAL) 200 MG tablet Take 200 mg by mouth daily.   propranolol (INDERAL) 10 MG tablet Take 5 mg by mouth as needed.   "

## 2024-05-12 NOTE — Patient Instructions (Signed)
 Medication Instructions:  Your physician recommends that you continue on your current medications as directed. Please refer to the Current Medication list given to you today.  *If you need a refill on your cardiac medications before your next appointment, please call your pharmacy*  Lab Work: None.  If you have labs (blood work) drawn today and your tests are completely normal, you will receive your results only by: MyChart Message (if you have MyChart) OR A paper copy in the mail If you have any lab test that is abnormal or we need to change your treatment, we will call you to review the results.  Testing/Procedures: GEOFFRY HEWS- Long Term Monitor Instructions  Your physician has requested you wear a ZIO patch monitor for 14 days.  This is a single patch monitor. [employer] supplies one patch monitor per enrollment. Additional stickers are not available. Please do not apply patch if you will be having a Nuclear Stress Test,  Echocardiogram, Cardiac CT, MRI, or Chest Xray during the period you would be wearing the  monitor. The patch cannot be worn during these tests. You cannot remove and re-apply the  ZIO XT patch monitor.  Your ZIO patch monitor will be mailed 3 day USPS to your address on file. It may take 3-5 days  to receive your monitor after you have been enrolled.  Once you have received your monitor, please review the enclosed instructions. Your monitor  has already been registered assigning a specific monitor serial # to you.  Billing and Patient Assistance Program Information  We have supplied [employer] with any of your insurance information on file for billing purposes. [employer] offers a sliding scale Patient Assistance Program for patients that do not have  insurance, or whose insurance does not completely cover the cost of the ZIO monitor.  You must apply for the Patient Assistance Program to qualify for this discounted rate.  To apply, please call [employer] at [phone], select  option 4, select option 2, ask to apply for  Patient Assistance Program. Meredeth will ask your household income, and how many people  are in your household. They will quote your out-of-pocket cost based on that information.  [employer] will also be able to set up a 25-month, interest-free payment plan if needed.  Applying the monitor   Shave hair from upper left chest.  Hold abrader disc by orange tab. Rub abrader in 40 strokes over the upper left chest as  indicated in your monitor instructions.  Clean area with 4 enclosed alcohol pads. Let dry.  Apply patch as indicated in monitor instructions. Patch will be placed under collarbone on left  side of chest with arrow pointing upward.  Rub patch adhesive wings for 2 minutes. Remove white label marked 1. Remove the white  label marked 2. Rub patch adhesive wings for 2 additional minutes.  While looking in a mirror, press and release button in center of patch. A small green light will  flash 3-4 times. This will be your only indicator that the monitor has been turned on.  Do not shower for the first 24 hours. You may shower after the first 24 hours.  Press the button if you feel a symptom. You will hear a small click. Record Date, Time and  Symptom in the Patient Logbook.  When you are ready to remove the patch, follow instructions on the last 2 pages of Patient  Logbook. Stick patch monitor onto the last page of Patient Logbook.  Place Patient Logbook in the  blue and white box. Use locking tab on box and tape box closed  securely. The blue and white box has prepaid postage on it. Please place it in the mailbox as  soon as possible. Your physician should have your test results approximately 7 days after the  monitor has been mailed back to [facility].  Call [facility] Customer Care at [phone] if you have questions regarding  your ZIO XT patch monitor. Call them immediately if you see an orange light blinking on your  monitor.   If your monitor falls off in less than 4 days, contact our Monitor department at [phone].  If your monitor becomes loose or falls off after 4 days call [employer] at [phone] for  suggestions on securing your monitor   Follow-Up: At [facility], you and your health needs are our priority.  As part of our continuing mission to provide you with exceptional heart care, our providers are all part of one team.  This team includes your primary Cardiologist (physician) and Advanced Practice Providers or APPs (Physician Assistants and Nurse Practitioners) who all work together to provide you with the care you need, when you need it.  Your next appointment will be dependent on the results of your testing with:  Provider:   Dr. Vina Gull, MD   We recommend signing up for the patient portal called MyChart.  Sign up information is provided on this After Visit Summary.  MyChart is used to connect with patients for Virtual Visits (Telemedicine).  Patients are able to view lab/test results, encounter notes, upcoming appointments, etc.  Non-urgent messages can be sent to your provider as well.   To learn more about what you can do with MyChart, go to [email or web address].   Other Instructions [email or web address]
